# Patient Record
Sex: Female | Born: 1945 | Race: White | Hispanic: No | Marital: Married
Health system: Southern US, Community
[De-identification: ages and names within clinical notes are randomized; demographics above are authoritative.]

---

## 2020-04-04 ENCOUNTER — Encounter (HOSPITAL_COMMUNITY): Payer: Self-pay | Admitting: Emergency Medicine

## 2020-04-04 ENCOUNTER — Other Ambulatory Visit: Payer: Self-pay

## 2020-04-04 ENCOUNTER — Emergency Department (HOSPITAL_COMMUNITY)
Admission: EM | Admit: 2020-04-04 | Discharge: 2020-04-04 | Disposition: A | Discharge status: Home / Self Care | Payer: Medicare Other | Attending: Emergency Medicine | Admitting: Emergency Medicine

## 2020-04-04 ENCOUNTER — Emergency Department (HOSPITAL_COMMUNITY): Payer: Medicare Other

## 2020-04-04 DIAGNOSIS — U071 COVID-19: Secondary | ICD-10-CM | POA: Insufficient documentation

## 2020-04-04 DIAGNOSIS — R5383 Other fatigue: Secondary | ICD-10-CM | POA: Diagnosis present

## 2020-04-04 LAB — LACTATE DEHYDROGENASE: LDH: 279 U/L — ABNORMAL HIGH (ref 98–192)

## 2020-04-04 LAB — COMPREHENSIVE METABOLIC PANEL
ALT: 32 U/L (ref 0–44)
AST: 54 U/L — ABNORMAL HIGH (ref 15–41)
Albumin: 3.4 g/dL — ABNORMAL LOW (ref 3.5–5.0)
Alkaline Phosphatase: 52 U/L (ref 38–126)
Anion gap: 12 (ref 5–15)
BUN: 19 mg/dL (ref 8–23)
CO2: 21 mmol/L — ABNORMAL LOW (ref 22–32)
Calcium: 8.5 mg/dL — ABNORMAL LOW (ref 8.9–10.3)
Chloride: 100 mmol/L (ref 98–111)
Creatinine, Ser: 1.22 mg/dL — ABNORMAL HIGH (ref 0.44–1.00)
GFR, Estimated: 47 mL/min — ABNORMAL LOW (ref >60)
Glucose, Bld: 114 mg/dL — ABNORMAL HIGH (ref 70–99)
Potassium: 4.6 mmol/L (ref 3.5–5.1)
Sodium: 133 mmol/L — ABNORMAL LOW (ref 135–145)
Total Bilirubin: 1.1 mg/dL (ref 0.3–1.2)
Total Protein: 7 g/dL (ref 6.5–8.1)

## 2020-04-04 LAB — CBC WITH DIFFERENTIAL/PLATELET
Abs Immature Granulocytes: 0.04 10*3/uL (ref 0.00–0.07)
Basophils Absolute: 0 10*3/uL (ref 0.0–0.1)
Basophils Relative: 0 %
Eosinophils Absolute: 0 10*3/uL (ref 0.0–0.5)
Eosinophils Relative: 0 %
HCT: 43.3 % (ref 36.0–46.0)
Hemoglobin: 14.3 g/dL (ref 12.0–15.0)
Immature Granulocytes: 1 %
Lymphocytes Relative: 24 %
Lymphs Abs: 1.9 10*3/uL (ref 0.7–4.0)
MCH: 32.1 pg (ref 26.0–34.0)
MCHC: 33 g/dL (ref 30.0–36.0)
MCV: 97.1 fL (ref 80.0–100.0)
Monocytes Absolute: 0.6 10*3/uL (ref 0.1–1.0)
Monocytes Relative: 8 %
Neutro Abs: 5.4 10*3/uL (ref 1.7–7.7)
Neutrophils Relative %: 67 %
Platelets: 193 10*3/uL (ref 150–400)
RBC: 4.46 MIL/uL (ref 3.87–5.11)
RDW: 12.7 % (ref 11.5–15.5)
WBC: 8 10*3/uL (ref 4.0–10.5)
nRBC: 0 % (ref 0.0–0.2)

## 2020-04-04 LAB — I-STAT VENOUS BLOOD GAS, ED
Acid-Base Excess: 2 mmol/L (ref 0.0–2.0)
Bicarbonate: 26.6 mmol/L (ref 20.0–28.0)
Calcium, Ion: 1.04 mmol/L — ABNORMAL LOW (ref 1.15–1.40)
HCT: 43 % (ref 36.0–46.0)
Hemoglobin: 14.6 g/dL (ref 12.0–15.0)
O2 Saturation: 41 %
Potassium: 4.7 mmol/L (ref 3.5–5.1)
Sodium: 134 mmol/L — ABNORMAL LOW (ref 135–145)
TCO2: 28 mmol/L (ref 22–32)
pCO2, Ven: 40.1 mmHg — ABNORMAL LOW (ref 44.0–60.0)
pH, Ven: 7.43 (ref 7.250–7.430)
pO2, Ven: 23 mmHg — CL (ref 32.0–45.0)

## 2020-04-04 LAB — FERRITIN: Ferritin: 423 ng/mL — ABNORMAL HIGH (ref 11–307)

## 2020-04-04 LAB — C-REACTIVE PROTEIN: CRP: 5.9 mg/dL — ABNORMAL HIGH (ref <1.0)

## 2020-04-04 LAB — RESPIRATORY PANEL BY RT PCR (FLU A&B, COVID)
Influenza A by PCR: NEGATIVE
Influenza B by PCR: NEGATIVE
SARS Coronavirus 2 by RT PCR: POSITIVE — AB

## 2020-04-04 LAB — TRIGLYCERIDES: Triglycerides: 116 mg/dL (ref <150)

## 2020-04-04 LAB — PROCALCITONIN: Procalcitonin: 0.11 ng/mL

## 2020-04-04 LAB — D-DIMER, QUANTITATIVE: D-Dimer, Quant: 0.91 ug/mL-FEU — ABNORMAL HIGH (ref 0.00–0.50)

## 2020-04-04 LAB — FIBRINOGEN: Fibrinogen: 538 mg/dL — ABNORMAL HIGH (ref 210–475)

## 2020-04-04 LAB — LACTIC ACID, PLASMA: Lactic Acid, Venous: 1 mmol/L (ref 0.5–1.9)

## 2020-04-04 MED ORDER — ALBUTEROL SULFATE HFA 108 (90 BASE) MCG/ACT IN AERS
2.0000 | INHALATION_SPRAY | Freq: Once | RESPIRATORY_TRACT | Status: AC
  Administered 2020-04-04: 2 via RESPIRATORY_TRACT
  Filled 2020-04-04: qty 6.7

## 2020-04-04 MED ORDER — DEXAMETHASONE SODIUM PHOSPHATE 10 MG/ML IJ SOLN
10.0000 mg | Freq: Once | INTRAMUSCULAR | Status: AC
  Administered 2020-04-04: 10 mg via INTRAVENOUS
  Filled 2020-04-04: qty 1

## 2020-04-04 MED ORDER — SODIUM CHLORIDE 0.9 % IV BOLUS
500.0000 mL | Freq: Once | INTRAVENOUS | Status: AC
  Administered 2020-04-04: 500 mL via INTRAVENOUS

## 2020-04-04 NOTE — Discharge Instructions (Addendum)
Use albuterol every 4 hrs for cough or wheezing   Stay hydrated   You have COVID and will need to stay home for 10 days   Get a pulse ox and if your oxygen level is persistently less than 90% then return to the ED for admission   See your doctor   Return to ER if you have worse cough, wheezing, trouble breathing, dehydration

## 2020-04-04 NOTE — ED Provider Notes (Signed)
[facility], [place] EMERGENCY DEPARTMENT Provider Note   CSN: [id number] Arrival date & time: 04/04/20  1833     History Chief Complaint  Patient presents with  . Covid +  . Fatigue    Amanda Decker is a 74 y.o. female here presenting with COVID and fatigue.  Patient states that she is not vaccinated.  She states that 2 days ago she tested positive for Covid.  She states that she went to infusion center and had antibody infusion.  Since then she has been sleeping a lot.  She states that she just has not been feeling well.  She denies any chest pain or shortness of breath or fevers.  Her son told her to come here for further evaluation.  Denies any underlying lung disease.  Denies any heart conditions.  The history is provided by the patient.       History reviewed. No pertinent past medical history.  There are no problems to display for this patient.   History reviewed. No pertinent surgical history.   OB History   No obstetric history on file.     No family history on file.  Social History   Tobacco Use  . Smoking status: Not on file  Substance Use Topics  . Alcohol use: Not on file  . Drug use: Not on file    Home Medications Prior to Admission medications   Not on File    Allergies    Patient has no allergy information on record.  Review of Systems   Review of Systems  Neurological: Positive for weakness.  All other systems reviewed and are negative.   Physical Exam Updated Vital Signs BP (!) 157/89   Pulse 87   Temp 99.5 F (37.5 C) (Oral)   Resp 20   Ht 5\' 5"  (1.651 m)   Wt 70.3 kg   SpO2 96%   BMI 25.79 kg/m   Physical Exam Vitals and nursing note reviewed.  Constitutional:      Comments: Slightly dehydrated  HENT:     Head: Normocephalic.     Nose: Nose normal.     Mouth/Throat:     Mouth: Mucous membranes are dry.  Eyes:     Extraocular Movements: Extraocular movements intact.     Pupils: Pupils are equal, round, and  reactive to light.  Cardiovascular:     Rate and Rhythm: Normal rate and regular rhythm.     Pulses: Normal pulses.     Heart sounds: Normal heart sounds.  Pulmonary:     Effort: Pulmonary effort is normal.     Breath sounds: Normal breath sounds.  Abdominal:     General: Abdomen is flat.     Palpations: Abdomen is soft.  Musculoskeletal:        General: Normal range of motion.     Cervical back: Normal range of motion and neck supple.  Skin:    General: Skin is warm.     Capillary Refill: Capillary refill takes less than 2 seconds.  Neurological:     General: No focal deficit present.     Mental Status: She is oriented to person, place, and time.  Psychiatric:        Mood and Affect: Mood normal.        Behavior: Behavior normal.     ED Results / Procedures / Treatments   Labs (all labs ordered are listed, but only abnormal results are displayed) Labs Reviewed  COMPREHENSIVE METABOLIC PANEL - Abnormal; Notable  for the following components:      Result Value   Sodium 133 (*)    CO2 21 (*)    Glucose, Bld 114 (*)    Creatinine, Ser 1.22 (*)    Calcium 8.5 (*)    Albumin 3.4 (*)    AST 54 (*)    GFR, Estimated 47 (*)    All other components within normal limits  LACTATE DEHYDROGENASE - Abnormal; Notable for the following components:   LDH 279 (*)    All other components within normal limits  I-STAT VENOUS BLOOD GAS, ED - Abnormal; Notable for the following components:   pCO2, Ven 40.1 (*)    pO2, Ven 23.0 (*)    Sodium 134 (*)    Calcium, Ion 1.04 (*)    All other components within normal limits  RESPIRATORY PANEL BY RT PCR (FLU A&B, COVID)  CULTURE, BLOOD (ROUTINE X 2)  CULTURE, BLOOD (ROUTINE X 2)  LACTIC ACID, PLASMA  CBC WITH DIFFERENTIAL/PLATELET  TRIGLYCERIDES  LACTIC ACID, PLASMA  PROCALCITONIN  FERRITIN  C-REACTIVE PROTEIN  BLOOD GAS, VENOUS  D-DIMER, QUANTITATIVE (NOT AT [facility])  FIBRINOGEN    EKG None  Radiology DG Chest Port 1 View  Result  Date: 04/04/2020 CLINICAL DATA:  Shortness of breath. EXAM: PORTABLE CHEST 1 VIEW COMPARISON:  None. FINDINGS: Mild linear atelectasis is seen within the bilateral lung bases and mid right lung. There is no evidence of a pleural effusion or pneumothorax. The heart size and mediastinal contours are within normal limits. A calcified left breast implant is seen. The visualized skeletal structures are unremarkable. IMPRESSION: Mild bibasilar and mid right lung linear atelectasis. Electronically Signed   By: Aram Candela M.D.   On: 04/04/2020 20:05    Procedures Procedures (including critical care time)  Medications Ordered in ED Medications  sodium chloride 0.9 % bolus 500 mL (0 mLs Intravenous Stopped 04/04/20 2100)  dexamethasone (DECADRON) injection 10 mg (10 mg Intravenous Given 04/04/20 2009)  albuterol (VENTOLIN HFA) 108 (90 Base) MCG/ACT inhaler 2 puff (2 puffs Inhalation Given 04/04/20 2009)    ED Course  I have reviewed the triage vital signs and the nursing notes.  Pertinent labs & imaging results that were available during my care of the patient were reviewed by me and considered in my medical decision making (see chart for details).    MDM Rules/Calculators/A&P                         Amanda Decker is a 74 y.o. female here presenting with fatigue after Covid diagnosis.  Patient does not hypoxic and has no oxygen requirement.  She received antibiotic infusion already.  Plan to get Covid preadmission labs and chest x-ray.  Will hydrate and reassess.  10:09 PM Labs are consistent with Covid.  Patient's oxygen level maintaining around 93%.  Patient's pH is 7.4.  Her inflammatory markers are all elevated.  Since she has no oxygen requirement and felt better, will discharge home.  Told her to use albuterol as needed for cough and get a pulse ox and if her oxygen is persisting low she needs to be admitted.    Final Clinical Impression(s) / ED Diagnoses Final diagnoses:  None     Rx / DC Orders ED Discharge Orders    None       Charlynne Pander, MD 04/04/20 2210

## 2020-04-04 NOTE — ED Triage Notes (Signed)
Pt coming from home. States she was diagnosed with Covid and is feeling increased malaise following antibody infusion. VSS. NAD.

## 2020-04-06 ENCOUNTER — Telehealth: Payer: Self-pay | Admitting: Physician Assistant

## 2020-04-06 NOTE — Telephone Encounter (Signed)
Called to discuss with patient about Covid symptoms and the use of sotrovimab, bamlanivimab/etesevimab or casirivimab/imdevimab, a monoclonal antibody infusion for those with mild to moderate Covid symptoms and at a high risk of hospitalization.  Pt is qualified for this infusion at the [facility] due to; Specific high risk criteria : Older age (>/= 74 yo) and BMI > 25   Message left to call back our hotline [phone]. My chart message sent if active on Mychart.   Cline Crock PA-C

## 2020-04-09 LAB — CULTURE, BLOOD (ROUTINE X 2)
Culture: NO GROWTH
Culture: NO GROWTH
Special Requests: ADEQUATE
Special Requests: ADEQUATE

## 2022-03-27 IMAGING — DX DG CHEST 1V PORT
1 series · 1 of 1 positions shown · non-contrast
Comparison: None.

CLINICAL DATA: Shortness of breath.

EXAM:
PORTABLE CHEST 1 VIEW

[chest ap]
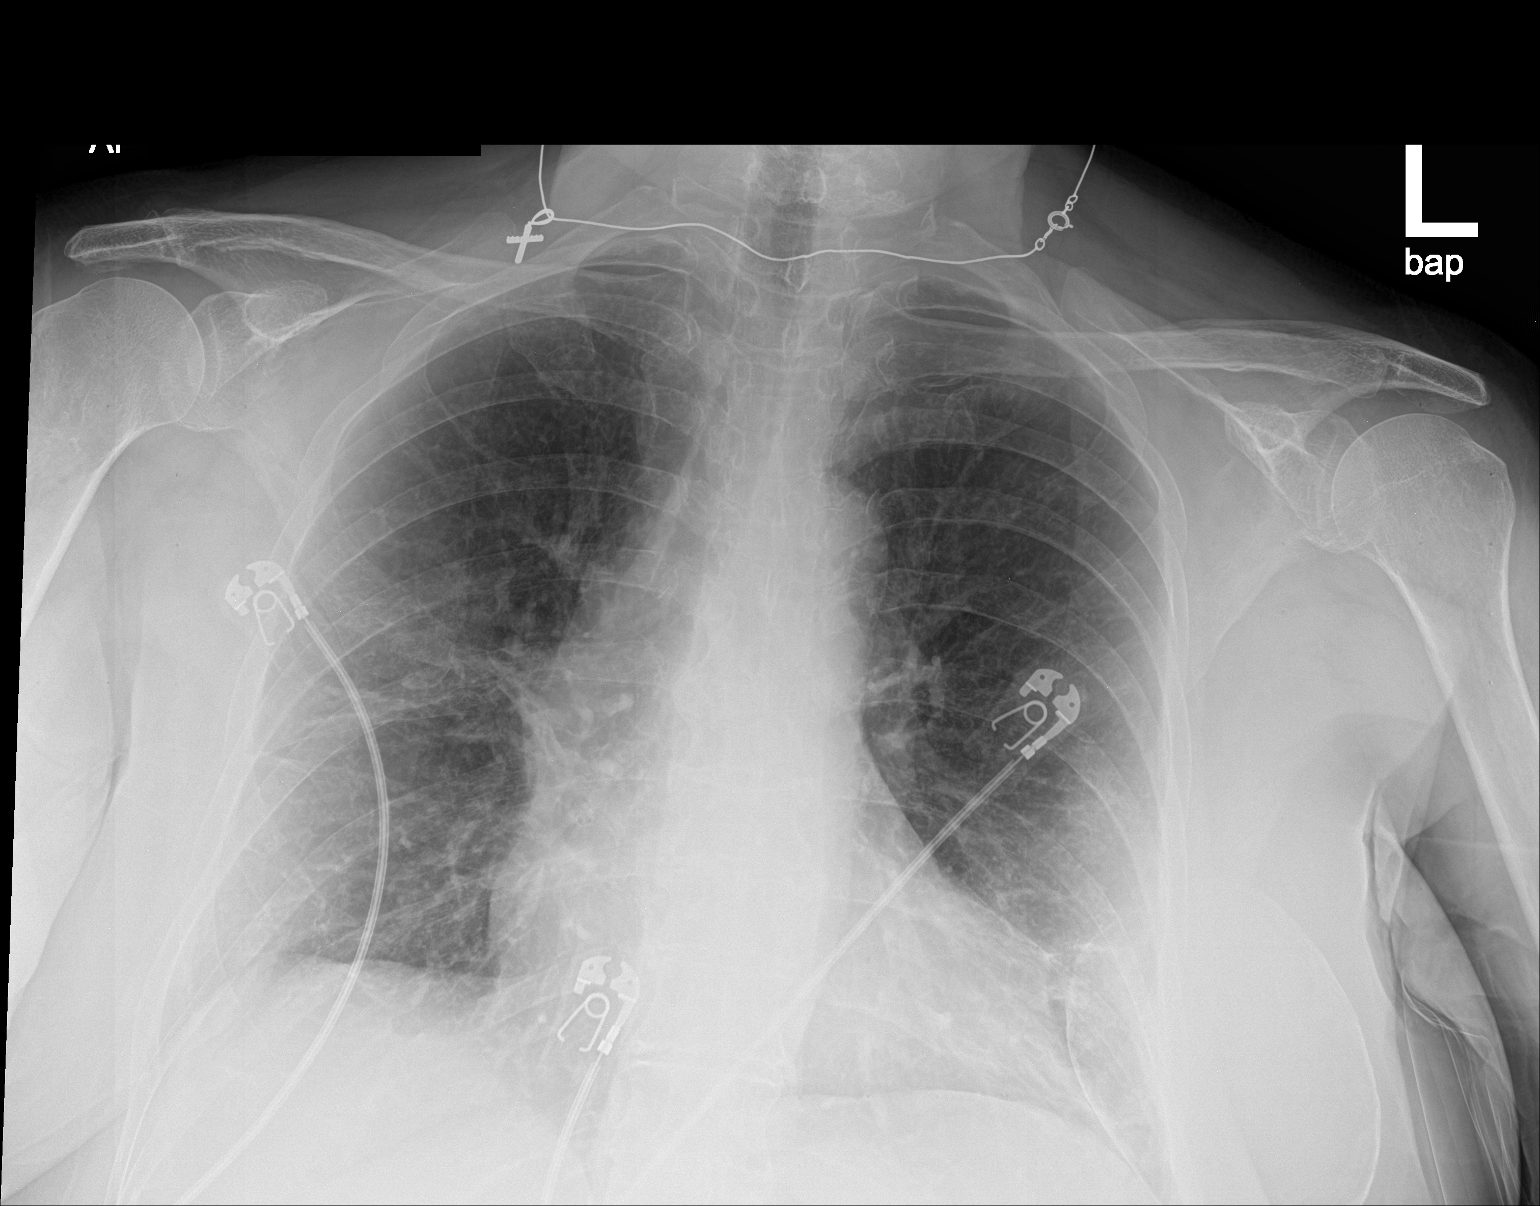

[1 of 1 positions shown; findings below may reference images not displayed]

FINDINGS: Mild linear atelectasis is seen within the bilateral lung bases and
mid right lung. There is no evidence of a pleural effusion or
pneumothorax. The heart size and mediastinal contours are within
normal limits. A calcified left breast implant is seen. The
visualized skeletal structures are unremarkable.
IMPRESSION: Mild bibasilar and mid right lung linear atelectasis.
